# Patient Record
Sex: Male | Born: 1990 | Race: Black or African American | Hispanic: No | Marital: Single | Smoking: Current every day smoker
Health system: Southern US, Community
[De-identification: ages and names within clinical notes are randomized; demographics above are authoritative.]

## PROBLEM LIST (undated history)

## (undated) DIAGNOSIS — L0291 Cutaneous abscess, unspecified: Secondary | ICD-10-CM

---

## 2011-10-26 ENCOUNTER — Encounter (HOSPITAL_COMMUNITY): Payer: Self-pay | Admitting: *Deleted

## 2011-10-26 ENCOUNTER — Emergency Department (INDEPENDENT_AMBULATORY_CARE_PROVIDER_SITE_OTHER)
Admission: EM | Admit: 2011-10-26 | Discharge: 2011-10-26 | Disposition: A | Discharge status: Home / Self Care | Payer: Self-pay | Source: Home / Self Care

## 2011-10-26 DIAGNOSIS — L0202 Furuncle of face: Secondary | ICD-10-CM

## 2011-10-26 HISTORY — DX: Cutaneous abscess, unspecified: L02.91

## 2011-10-26 MED ORDER — DOXYCYCLINE HYCLATE 100 MG PO CAPS: 100.0000 mg | ORAL_CAPSULE | Freq: Two times a day (BID) | ORAL | Status: AC

## 2011-10-26 NOTE — ED Provider Notes (Signed)
History     CSN: [id number]  Arrival date & time 10/26/11  7829   None     Chief Complaint  Patient presents with  . Facial Swelling  . Abscess    (Consider location/radiation/quality/duration/timing/severity/associated sxs/prior treatment) HPI Comments: Patient reports a painful swelling on his left cheek x 2 days.  States he squeezed it an pus came out, but then the lesion increased in size overnight.  Denies fevers, dental pain, sore throat, ear pain, difficulty swallowing or breathing.    Patient is a 21 y.o. male presenting with abscess. The history is provided by the patient.  Abscess  Pertinent negatives include no fever, no congestion, no rhinorrhea and no sore throat.    Past Medical History  Diagnosis Date  . Abscess     History reviewed. No pertinent past surgical history.  History reviewed. No pertinent family history.  History  Substance Use Topics  . Smoking status: Current Everyday Smoker  . Smokeless tobacco: Not on file  . Alcohol Use: No      Review of Systems  Constitutional: Negative for fever.  HENT: Negative for congestion, sore throat, rhinorrhea, mouth sores, trouble swallowing, neck stiffness and sinus pressure.   Respiratory: Negative for shortness of breath and stridor.   All other systems reviewed and are negative.    Allergies  Ibuprofen  Home Medications   Current Outpatient Rx  Name Route Sig Dispense Refill  . ASPIRIN 325 MG PO TABS Oral Take 325 mg by mouth daily.      BP 129/72  Pulse 58  Temp(Src) 98.5 F (36.9 C) (Oral)  Resp 14  SpO2 99%  Physical Exam  Nursing note and vitals reviewed. Constitutional: He is oriented to person, place, and time. He appears well-developed and well-nourished.  HENT:  Head: Normocephalic and atraumatic.    Left Ear: Tympanic membrane and external ear normal.  Mouth/Throat: Uvula is midline, oropharynx is clear and moist and mucous membranes are normal. Normal dentition. No  dental abscesses or uvula swelling. No oropharyngeal exudate.  Neck: Normal range of motion. Neck supple.  Pulmonary/Chest: Effort normal.  Neurological: He is alert and oriented to person, place, and time.    ED Course  Procedures (including critical care time)  Labs Reviewed - No data to display No results found.   1. Boil, face       MDM  Patient with small-medium sized boil/abscess left cheek.  Pt is well-appearing, nontoxic, afebrile, no e/o cellulitis.  Advised warm moist compresses throughout the day to encourage drainage as well as trial of antibiotics.  Patient is aware that he may need to have I&D performed if area does not drain and improve with this treatment.  Patient verbalizes understanding.          Rise Patience, [place] 10/26/11 1118

## 2011-10-26 NOTE — ED Notes (Signed)
Pt with abscess left side of face onset x 2 days - per pt squeezed and drainage - increased pain and swelling

## 2011-10-28 NOTE — ED Provider Notes (Signed)
Medical screening examination/treatment/procedure(s) were performed by non-physician practitioner and as supervising physician I was immediately available for consultation/collaboration.  Corrie Mckusick, MD 10/28/11 [phone]

## 2012-11-10 ENCOUNTER — Encounter (HOSPITAL_BASED_OUTPATIENT_CLINIC_OR_DEPARTMENT_OTHER): Payer: Self-pay | Admitting: *Deleted

## 2012-11-10 ENCOUNTER — Emergency Department (HOSPITAL_BASED_OUTPATIENT_CLINIC_OR_DEPARTMENT_OTHER): Payer: Self-pay

## 2012-11-10 ENCOUNTER — Emergency Department (HOSPITAL_BASED_OUTPATIENT_CLINIC_OR_DEPARTMENT_OTHER)
Admission: EM | Admit: 2012-11-10 | Discharge: 2012-11-10 | Disposition: A | Discharge status: Home / Self Care | Payer: Self-pay | Attending: Emergency Medicine | Admitting: Emergency Medicine

## 2012-11-10 DIAGNOSIS — Z872 Personal history of diseases of the skin and subcutaneous tissue: Secondary | ICD-10-CM | POA: Insufficient documentation

## 2012-11-10 DIAGNOSIS — R1084 Generalized abdominal pain: Secondary | ICD-10-CM | POA: Insufficient documentation

## 2012-11-10 DIAGNOSIS — F17200 Nicotine dependence, unspecified, uncomplicated: Secondary | ICD-10-CM | POA: Insufficient documentation

## 2012-11-10 DIAGNOSIS — Z7982 Long term (current) use of aspirin: Secondary | ICD-10-CM | POA: Insufficient documentation

## 2012-11-10 LAB — CBC WITH DIFFERENTIAL/PLATELET
Band Neutrophils: 0 % (ref 0–10)
Eosinophils Absolute: 0.2 10*3/uL (ref 0.0–0.7)
HCT: 45.7 % (ref 39.0–52.0)
Hemoglobin: 16.1 g/dL (ref 13.0–17.0)
LUC, Absolute: 0 10*3/uL (ref 0.0–0.5)
Lymphs Abs: 1.5 10*3/uL (ref 0.7–4.0)
MCV: 90.1 fL (ref 78.0–100.0)
Monocytes Absolute: 0.4 10*3/uL (ref 0.1–1.0)
Monocytes Relative: 7 % (ref 3–12)
Neutrophils Relative %: 63 % (ref 43–77)
Other: 0 %
Platelets: 113 10*3/uL — ABNORMAL LOW (ref 150–400)
Promyelocytes Absolute: 0 %
RBC: 5.07 MIL/uL (ref 4.22–5.81)
WBC: 5.6 10*3/uL (ref 4.0–10.5)

## 2012-11-10 LAB — COMPREHENSIVE METABOLIC PANEL
ALT: 16 U/L (ref 0–53)
Alkaline Phosphatase: 49 U/L (ref 39–117)
CO2: 24 mEq/L (ref 19–32)
GFR calc Af Amer: >90 mL/min (ref >90)
GFR calc non Af Amer: >90 mL/min (ref >90)
Glucose, Bld: 70 mg/dL (ref 70–99)
Potassium: 4.2 mEq/L (ref 3.5–5.1)
Sodium: 141 mEq/L (ref 135–145)

## 2012-11-10 LAB — URINALYSIS, ROUTINE W REFLEX MICROSCOPIC
Bilirubin Urine: NEGATIVE
Hgb urine dipstick: NEGATIVE
Protein, ur: NEGATIVE mg/dL
Urobilinogen, UA: 1 mg/dL (ref 0.0–1.0)

## 2012-11-10 LAB — URINE MICROSCOPIC-ADD ON

## 2012-11-10 MED ORDER — IOHEXOL 300 MG/ML  SOLN
100.0000 mL | Freq: Once | INTRAMUSCULAR | Status: AC | PRN
  Administered 2012-11-10: 100 mL via INTRAVENOUS

## 2012-11-10 MED ORDER — ONDANSETRON HCL 4 MG/2ML IJ SOLN
4.0000 mg | Freq: Once | INTRAMUSCULAR | Status: AC
  Administered 2012-11-10: 4 mg via INTRAVENOUS
  Filled 2012-11-10: qty 2

## 2012-11-10 MED ORDER — SODIUM CHLORIDE 0.9 % IV BOLUS (SEPSIS)
1000.0000 mL | Freq: Once | INTRAVENOUS | Status: AC
  Administered 2012-11-10: 1000 mL via INTRAVENOUS

## 2012-11-10 MED ORDER — IOHEXOL 300 MG/ML  SOLN
50.0000 mL | Freq: Once | INTRAMUSCULAR | Status: AC | PRN
  Administered 2012-11-10: 50 mL via ORAL

## 2012-11-10 NOTE — ED Provider Notes (Signed)
History     CSN: [id number]  Arrival date & time 11/10/12  0912   First MD Initiated Contact with Patient 11/10/12 [phone]      Chief Complaint  Patient presents with  . Abdominal Pain    (Consider location/radiation/quality/duration/timing/severity/associated sxs/prior treatment) HPI Patient complaining of pain began last night in lower abdomen with some radiation to epigastrium.  Denies nausea or vomiting.  Last ate last night at 8 - pain began after.  States hungry now.  Pain is sharp, better when he flexes trunk, no worsening features.  Patient drove here without problem.  Bowel movements normal last bm yesterday.  No urinary problems.    Past Medical History  Diagnosis Date  . Abscess     History reviewed. No pertinent past surgical history.  History reviewed. No pertinent family history.  History  Substance Use Topics  . Smoking status: Current Every Day Smoker  . Smokeless tobacco: Not on file  . Alcohol Use: No      Review of Systems  Constitutional: Negative.   HENT: Negative.   Eyes: Negative.   Respiratory: Negative.   Cardiovascular: Negative.   Gastrointestinal: Positive for abdominal pain and abdominal distention. Negative for nausea, vomiting, diarrhea, constipation, blood in stool and rectal pain.  Genitourinary: Negative for dysuria, discharge, scrotal swelling, enuresis, difficulty urinating, penile pain and testicular pain.  Musculoskeletal: Negative for myalgias, back pain and arthralgias.  Neurological: Negative for dizziness, seizures and headaches.  Hematological: Negative.     Allergies  Ibuprofen  Home Medications   Current Outpatient Rx  Name  Route  Sig  Dispense  Refill  . ASPIRIN 325 MG PO TABS   Oral   Take 325 mg by mouth daily.           BP 108/87  Pulse 64  Temp 98.1 F (36.7 C) (Oral)  Resp 18  SpO2 99%  Physical Exam  Nursing note and vitals reviewed. Constitutional: He is oriented to person, place, and time. He  appears well-developed and well-nourished.  HENT:  Head: Normocephalic and atraumatic.  Right Ear: External ear normal.  Left Ear: External ear normal.  Nose: Nose normal.  Mouth/Throat: Oropharynx is clear and moist.  Eyes: Conjunctivae normal and EOM are normal. Pupils are equal, round, and reactive to light.  Neck: Normal range of motion. Neck supple.  Cardiovascular: Normal rate, regular rhythm, normal heart sounds and intact distal pulses.   Pulmonary/Chest: Effort normal.  Abdominal: Soft. He exhibits no mass. There is tenderness. There is no rebound and no guarding.  Musculoskeletal: Normal range of motion.  Neurological: He is alert and oriented to person, place, and time.  Skin: Skin is warm and dry.  Psychiatric: He has a normal mood and affect. His behavior is normal. Judgment and thought content normal.    ED Course  Procedures (including critical care time)  Labs Reviewed - No data to display No results found.   No diagnosis found.    MDM   Results for orders placed during the hospital encounter of 11/10/12  COMPREHENSIVE METABOLIC PANEL      Component Value Range   Sodium 141  135 - 145 mEq/L   Potassium 4.2  3.5 - 5.1 mEq/L   Chloride 104  96 - 112 mEq/L   CO2 24  19 - 32 mEq/L   Glucose, Bld 70  70 - 99 mg/dL   BUN 11  6 - 23 mg/dL   Creatinine, Ser 6.57  0.50 - 1.35 mg/dL  Calcium 9.8  8.4 - 10.5 mg/dL   Total Protein 8.0  6.0 - 8.3 g/dL   Albumin 4.2  3.5 - 5.2 g/dL   AST 32  0 - 37 U/L   ALT 16  0 - 53 U/L   Alkaline Phosphatase 49  39 - 117 U/L   Total Bilirubin 0.5  0.3 - 1.2 mg/dL   GFR calc non Af Amer >90  >90 mL/min   GFR calc Af Amer >90  >90 mL/min  LIPASE, BLOOD      Component Value Range   Lipase 15  11 - 59 U/L  URINALYSIS, ROUTINE W REFLEX MICROSCOPIC      Component Value Range   Color, Urine YELLOW  YELLOW   APPearance CLEAR  CLEAR   Specific Gravity, Urine 1.027  1.005 - 1.030   pH 6.0  5.0 - 8.0   Glucose, UA NEGATIVE   NEGATIVE mg/dL   Hgb urine dipstick NEGATIVE  NEGATIVE   Bilirubin Urine NEGATIVE  NEGATIVE   Ketones, ur NEGATIVE  NEGATIVE mg/dL   Protein, ur NEGATIVE  NEGATIVE mg/dL   Urobilinogen, UA 1.0  0.0 - 1.0 mg/dL   Nitrite NEGATIVE  NEGATIVE   Leukocytes, UA SMALL (*) NEGATIVE  URINE MICROSCOPIC-ADD ON      Component Value Range   Squamous Epithelial / LPF RARE  RARE   WBC, UA 11-20  <3 WBC/hpf   RBC / HPF 0-2  <3 RBC/hpf   Bacteria, UA FEW (*) RARE   Urine-Other MUCOUS PRESENT     Ct Abdomen Pelvis W Contrast  11/10/2012  *RADIOLOGY REPORT*  Clinical Data: Abdominal pain.  CT ABDOMEN AND PELVIS WITH CONTRAST  Technique:  Multidetector CT imaging of the abdomen and pelvis was performed following the standard protocol during bolus administration of intravenous contrast.  Contrast: 50mL OMNIPAQUE IOHEXOL 300 MG/ML  SOLN, OMNIPAQUE IOHEXOL 300 MG/ML  SOLN  Comparison: None.  Findings: Lung bases are clear.  No pleural or pericardial effusion.  The gallbladder, liver, spleen, adrenal glands, pancreas and kidneys appear normal.  There is no lymphadenopathy or fluid.  The appendix is not discretely visualized but no evidence inflammatory process is seen in the abdomen or pelvis.  The stomach and small and large bowel are normal appearance.  No bony abnormality is seen.  IMPRESSION: Normal exam.   Original Report Authenticated By: Holley Dexter, M.D.    Patient without apparent acute intraabdominal abnormalities.  Patient advise regarding need for follow up and voices understanding.        Hilario Quarry, MD 11/10/12 1106

## 2012-11-10 NOTE — ED Notes (Signed)
Pt states "it might have been that I ate a lot of food and drank a lot of alcohol for the [employer]"

## 2012-11-10 NOTE — ED Notes (Signed)
Pt amb to room 5 with quick steady gait in nad. Pt reports "stomach ache" with abd cramping x last night. Denies any nausea, vomiting or diarrhea.

## 2012-11-10 NOTE — ED Notes (Signed)
Pt refuses iv access, pt states "I don't want a needle, I can't handle it!" pt encouraged to allow lab draw at least, but refuses. "I don't want no needles!" Dr. Rosalia Hammers informed.

## 2012-11-11 LAB — URINE CULTURE: Colony Count: 5000

## 2013-02-25 ENCOUNTER — Encounter (HOSPITAL_COMMUNITY): Payer: Self-pay

## 2013-02-25 ENCOUNTER — Emergency Department (HOSPITAL_COMMUNITY)
Admission: EM | Admit: 2013-02-25 | Discharge: 2013-02-25 | Disposition: A | Discharge status: Home / Self Care | Payer: No Typology Code available for payment source | Attending: Emergency Medicine | Admitting: Emergency Medicine

## 2013-02-25 DIAGNOSIS — S20412A Abrasion of left back wall of thorax, initial encounter: Secondary | ICD-10-CM

## 2013-02-25 DIAGNOSIS — T148XXA Other injury of unspecified body region, initial encounter: Secondary | ICD-10-CM

## 2013-02-25 DIAGNOSIS — Y9389 Activity, other specified: Secondary | ICD-10-CM | POA: Insufficient documentation

## 2013-02-25 DIAGNOSIS — S239XXA Sprain of unspecified parts of thorax, initial encounter: Secondary | ICD-10-CM | POA: Insufficient documentation

## 2013-02-25 DIAGNOSIS — Y92410 Unspecified street and highway as the place of occurrence of the external cause: Secondary | ICD-10-CM | POA: Insufficient documentation

## 2013-02-25 DIAGNOSIS — F17200 Nicotine dependence, unspecified, uncomplicated: Secondary | ICD-10-CM | POA: Insufficient documentation

## 2013-02-25 DIAGNOSIS — IMO0002 Reserved for concepts with insufficient information to code with codable children: Secondary | ICD-10-CM | POA: Insufficient documentation

## 2013-02-25 DIAGNOSIS — Z872 Personal history of diseases of the skin and subcutaneous tissue: Secondary | ICD-10-CM | POA: Insufficient documentation

## 2013-02-25 DIAGNOSIS — M549 Dorsalgia, unspecified: Secondary | ICD-10-CM

## 2013-02-25 MED ORDER — CYCLOBENZAPRINE HCL 10 MG PO TABS: 10.0000 mg | ORAL_TABLET | Freq: Two times a day (BID) | ORAL | Status: DC | PRN
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
## 2013-02-25 NOTE — Progress Notes (Signed)
[facility] has seen patient. Patient stated that he is pending insurance through his job. Provided with a list of primary care resources.

## 2013-02-25 NOTE — ED Provider Notes (Signed)
  Medical screening examination/treatment/procedure(s) were performed by non-physician practitioner and as supervising physician I was immediately available for consultation/collaboration.    Macaulay Reicher, MD 02/25/13 1527 
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
## 2014-01-24 ENCOUNTER — Emergency Department (HOSPITAL_COMMUNITY)
Admission: EM | Admit: 2014-01-24 | Discharge: 2014-01-24 | Disposition: A | Discharge status: Home / Self Care | Payer: No Typology Code available for payment source | Attending: Emergency Medicine | Admitting: Emergency Medicine

## 2014-01-24 ENCOUNTER — Emergency Department (HOSPITAL_COMMUNITY): Payer: No Typology Code available for payment source

## 2014-01-24 ENCOUNTER — Emergency Department (HOSPITAL_COMMUNITY): Payer: Self-pay

## 2014-01-24 ENCOUNTER — Encounter (HOSPITAL_COMMUNITY): Payer: Self-pay | Admitting: Emergency Medicine

## 2014-01-24 DIAGNOSIS — S0993XA Unspecified injury of face, initial encounter: Secondary | ICD-10-CM | POA: Insufficient documentation

## 2014-01-24 DIAGNOSIS — S82402A Unspecified fracture of shaft of left fibula, initial encounter for closed fracture: Secondary | ICD-10-CM

## 2014-01-24 DIAGNOSIS — S199XXA Unspecified injury of neck, initial encounter: Secondary | ICD-10-CM

## 2014-01-24 DIAGNOSIS — R404 Transient alteration of awareness: Secondary | ICD-10-CM | POA: Insufficient documentation

## 2014-01-24 DIAGNOSIS — F17200 Nicotine dependence, unspecified, uncomplicated: Secondary | ICD-10-CM | POA: Insufficient documentation

## 2014-01-24 DIAGNOSIS — Z872 Personal history of diseases of the skin and subcutaneous tissue: Secondary | ICD-10-CM | POA: Insufficient documentation

## 2014-01-24 DIAGNOSIS — Y92410 Unspecified street and highway as the place of occurrence of the external cause: Secondary | ICD-10-CM | POA: Insufficient documentation

## 2014-01-24 DIAGNOSIS — S0003XA Contusion of scalp, initial encounter: Secondary | ICD-10-CM | POA: Insufficient documentation

## 2014-01-24 DIAGNOSIS — S0083XA Contusion of other part of head, initial encounter: Secondary | ICD-10-CM | POA: Insufficient documentation

## 2014-01-24 DIAGNOSIS — S1093XA Contusion of unspecified part of neck, initial encounter: Secondary | ICD-10-CM

## 2014-01-24 DIAGNOSIS — Y9301 Activity, walking, marching and hiking: Secondary | ICD-10-CM | POA: Insufficient documentation

## 2014-01-24 DIAGNOSIS — S59919A Unspecified injury of unspecified forearm, initial encounter: Secondary | ICD-10-CM

## 2014-01-24 DIAGNOSIS — S6990XA Unspecified injury of unspecified wrist, hand and finger(s), initial encounter: Secondary | ICD-10-CM

## 2014-01-24 DIAGNOSIS — S59909A Unspecified injury of unspecified elbow, initial encounter: Secondary | ICD-10-CM | POA: Insufficient documentation

## 2014-01-24 DIAGNOSIS — S62501A Fracture of unspecified phalanx of right thumb, initial encounter for closed fracture: Secondary | ICD-10-CM

## 2014-01-24 DIAGNOSIS — S62609B Fracture of unspecified phalanx of unspecified finger, initial encounter for open fracture: Secondary | ICD-10-CM | POA: Insufficient documentation

## 2014-01-24 DIAGNOSIS — IMO0002 Reserved for concepts with insufficient information to code with codable children: Secondary | ICD-10-CM | POA: Insufficient documentation

## 2014-01-24 DIAGNOSIS — S82409A Unspecified fracture of shaft of unspecified fibula, initial encounter for closed fracture: Secondary | ICD-10-CM | POA: Insufficient documentation

## 2014-01-24 MED ORDER — OXYCODONE-ACETAMINOPHEN 5-325 MG PO TABS
2.0000 | ORAL_TABLET | Freq: Once | ORAL | Status: AC
  Administered 2014-01-24: 2 via ORAL
  Filled 2014-01-24: qty 2

## 2014-01-24 MED ORDER — OXYCODONE-ACETAMINOPHEN 5-325 MG PO TABS: 1.0000 | ORAL_TABLET | Freq: Four times a day (QID) | ORAL | Status: DC | PRN
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
## 2014-01-24 NOTE — Progress Notes (Signed)
[facility] provided pt with a list of primary care resources and a [facility] [employer] application to help patient establish primary care.  °

## 2014-01-24 NOTE — Discharge Instructions (Signed)
Keep left leg and right hand elevated as much as possible for next several days.   Fibular Fracture, Adult, Treated Without Immobilization You have a fracture (break) of your fibula. This is the bone in your lower leg located on the outside of the leg. These fractures are easily diagnosed with x-rays. TREATMENT  You have a simple fracture of the part of the fibula which is located between the knee and ankle. This bone usually will heal without problems and can often be treated without casting or splinting. This means the fracture will heal well during normal use and daily activities without being held in place. Sometimes a cast or splint is placed on these fractures if it is needed for comfort or if the bones are badly out of place. HOME CARE INSTRUCTIONS   Apply ice to the injury for 15-20 minutes, 03-04 times per day while awake, for 2 days. Put the ice in a plastic bag and place a thin towel between the bag of ice and your leg. This helps keep swelling down.  Use crutches as directed. Resume walking without crutches as directed by your caregiver or when comfortable doing so.  Only take over-the-counter or prescription medicines for pain, discomfort, or fever as directed by your caregiver.  Your caregiver may tell you to take off your removable cam boot.  Keep appointments for follow up X-rays if these are required.  Warning: Do not drive a car or operate a motor vehicle until your caregiver specifically tells you it is safe to do so. SEEK MEDICAL CARE IF:   You have continued severe pain or more swelling.  The medications do not control the pain.  Your skin or nails below the injury turn blue or grey or feel cold or numb.  You develop severe pain in the leg or foot. MAKE SURE YOU:   Understand these instructions.  Will watch your condition.  Will get help right away if you are not doing well or get worse. Document Released: 09/23/2005 Document Revised: 12/16/2011 Document  Reviewed: 04/29/2008 [facility] Patient Information 2014 [place], [place].  Thumb Fracture  There are many types of thumb fractures (breaks). There are different ways of treating these fractures, all of which may be correct, varying from case to case. Your caregiver will discuss different ways to treat these fractures with you. TREATMENT   Immobilization. This means the fracture is casted as it is without changing the positions of the fracture (bone pieces) involved. This fracture is casted in a "thumb spica" also called a hitchhiker cast. It is generally left on for 2 to 6 weeks.  Closed reduction. The bones are manipulated back into position without using surgery.  ORIF (open reduction and internal fixation). The fracture site is opened and the bone pieces are fixed into place with some type of hardware such as screws or wires. Your caregiver will discuss the type of fracture you have and the treatment that will be best for that problem. If surgery is the treatment of choice, the following is information for you to know and to let your caregiver know about prior to surgery. LET YOUR CAREGIVERS KNOW ABOUT:  Allergies.  Medications taken including herbs, eye drops, over the counter medications, and creams.  Use of steroids (by mouth or creams).  Previous problems with anesthetics or Novocain.  Family history of anesthetic complications..  Possibility of pregnancy, if this applies.  History of blood clots (thrombophlebitis).  History of bleeding or blood problems.  Previous surgery.  Other  health problems. AFTER THE PROCEDURE  After surgery, you will be taken to the recovery area. A nurse will watch and check your progress. Once you are awake, stable, and taking fluids well, barring other problems you will be allowed to go home. Once home, an ice pack applied to your operative site may help with discomfort and keep the swelling down. Elevate your hand above your heart as much as  possible for the first 4-5 days after the injury/surgery. HOME CARE INSTRUCTIONS   Follow your caregiver's instructions as to activities, exercises, physical therapy, and driving a car.  Use thumb and exercise as directed.  Only take over-the-counter or prescription medicines for pain, discomfort, or fever as directed by your caregiver. Do not take aspirin until your caregiver instructs. This can increase bleeding immediately following surgery. SEEK MEDICAL CARE IF:   There is increased bleeding (more than a small spot) from the wound or from beneath your cast or splint.  There is redness, swelling, or increasing pain in the wound or from beneath your cast or splint.  You have pus coming from wound or from beneath your cast or splint.  An unexplained oral temperature above 102 F (38.9 C) develops.  There is a foul smell coming from the wound or dressing or from beneath your cast or splint. SEEK IMMEDIATE MEDICAL CARE IF:   You develop severe pain, decreased sensation such as numbness or tingling.  You develop a rash.  You have difficulty breathing.  Youhave any allergic problems. If you do not have a window in your cast for observing the wound, a discharge or minor bleeding may show up as a stain on the outside of your cast. Report these findings to your caregiver. If you have a removable splint overlying the surgical dressings it is common to see a small amount of bleeding. Change the dressings as instructed by your caregiver. Document Released: 06/22/2003 Document Revised: 12/16/2011 Document Reviewed: 02/01/2008 [facility] Patient Information 2014 [place], [place].

## 2014-01-24 NOTE — ED Notes (Signed)
Spoke with [place]issa, [place].  Due to pt stating LOC, placing acuity 3 and ordering CT head/cervical.

## 2014-07-01 ENCOUNTER — Encounter (HOSPITAL_COMMUNITY): Payer: Self-pay | Admitting: Emergency Medicine

## 2014-07-01 ENCOUNTER — Emergency Department (HOSPITAL_COMMUNITY)
Admission: EM | Admit: 2014-07-01 | Discharge: 2014-07-01 | Disposition: A | Discharge status: Home / Self Care | Payer: No Typology Code available for payment source | Attending: Emergency Medicine | Admitting: Emergency Medicine

## 2014-07-01 DIAGNOSIS — Z872 Personal history of diseases of the skin and subcutaneous tissue: Secondary | ICD-10-CM | POA: Insufficient documentation

## 2014-07-01 DIAGNOSIS — F17200 Nicotine dependence, unspecified, uncomplicated: Secondary | ICD-10-CM | POA: Insufficient documentation

## 2014-07-01 DIAGNOSIS — T61771A Other fish poisoning, accidental (unintentional), initial encounter: Secondary | ICD-10-CM | POA: Insufficient documentation

## 2014-07-01 DIAGNOSIS — Y929 Unspecified place or not applicable: Secondary | ICD-10-CM | POA: Insufficient documentation

## 2014-07-01 DIAGNOSIS — T783XXA Angioneurotic edema, initial encounter: Secondary | ICD-10-CM | POA: Insufficient documentation

## 2014-07-01 DIAGNOSIS — Y9389 Activity, other specified: Secondary | ICD-10-CM | POA: Insufficient documentation

## 2014-07-01 DIAGNOSIS — T7840XA Allergy, unspecified, initial encounter: Secondary | ICD-10-CM

## 2014-07-01 MED ORDER — SODIUM CHLORIDE 0.9 % IV BOLUS (SEPSIS)
1000.0000 mL | Freq: Once | INTRAVENOUS | Status: AC
  Administered 2014-07-01: 1000 mL via INTRAVENOUS

## 2014-07-01 MED ORDER — FAMOTIDINE IN NACL 20-0.9 MG/50ML-% IV SOLN
20.0000 mg | Freq: Once | INTRAVENOUS | Status: AC
  Administered 2014-07-01: 20 mg via INTRAVENOUS
  Filled 2014-07-01: qty 50

## 2014-07-01 MED ORDER — FAMOTIDINE 20 MG PO TABS: 20.0000 mg | ORAL_TABLET | Freq: Two times a day (BID) | ORAL | Status: DC

## 2014-07-01 MED ORDER — METHYLPREDNISOLONE SODIUM SUCC 125 MG IJ SOLR
125.0000 mg | Freq: Once | INTRAMUSCULAR | Status: AC
  Administered 2014-07-01: 125 mg via INTRAVENOUS
  Filled 2014-07-01: qty 2

## 2014-07-01 MED ORDER — DIPHENHYDRAMINE HCL 50 MG/ML IJ SOLN
25.0000 mg | Freq: Once | INTRAMUSCULAR | Status: AC
  Administered 2014-07-01: 25 mg via INTRAVENOUS
  Filled 2014-07-01: qty 1

## 2014-07-01 MED ORDER — PREDNISONE 50 MG PO TABS: ORAL_TABLET | ORAL | Status: DC

## 2014-07-01 MED ORDER — EPINEPHRINE HCL 0.1 MG/ML IJ SOSY
0.3000 mg | PREFILLED_SYRINGE | Freq: Once | INTRAMUSCULAR | Status: AC
  Administered 2014-07-01: 0.3 mg via INTRAMUSCULAR
  Filled 2014-07-01: qty 10

## 2014-07-01 MED ORDER — DIPHENHYDRAMINE HCL 25 MG PO TABS: 50.0000 mg | ORAL_TABLET | ORAL | Status: DC | PRN

## 2014-07-01 MED ORDER — EPINEPHRINE 0.3 MG/0.3ML IJ SOAJ: 0.3000 mg | Freq: Once | INTRAMUSCULAR | Status: AC | PRN

## 2014-07-01 NOTE — ED Provider Notes (Signed)
History/physical exam/procedure(s) were performed by non-physician practitioner and as supervising physician I was immediately available for consultation/collaboration. I have reviewed all notes and am in agreement with care and plan.   Hilario Quarry, MD 07/01/14 [phone]

## 2014-07-01 NOTE — Progress Notes (Signed)
[facility] Community Liaison Stacy,  ° °Provided pt with a list of primary care resources and a [facility] [employer] Card application to help patient establish primary care.  °

## 2014-07-01 NOTE — ED Notes (Signed)
Pt presents with complaint of allergic reaction. Pt states that he had a meal last night around 2330 and has had itching and lip swelling that began around 0330. Pt has moderate swelling to upper and lower lip swelling with mild swelling in the lower face. Pt reports that he feels as if his throat is itching and feels like it is difficult to breath. Pt has an oxygen saturation of 100% on room air and is talking in full and complete sentences. Pt denies having any known allergies.

## 2014-07-01 NOTE — ED Provider Notes (Signed)
CSN: [id number]     Arrival date & time 07/01/14  1319 History   First MD Initiated Contact with Patient 07/01/14 1332     Chief Complaint  Patient presents with  . Allergic Reaction     (Consider location/radiation/quality/duration/timing/severity/associated sxs/prior Treatment) Patient is a 23 y.o. male presenting with allergic reaction.  Allergic Reaction   Jerrad Mendibles is a 23 y.o. male who is otherwise healthy complaining of allergic reaction onset at 3:30 AM last night, it woke him from sleep. Patient states that he ate chicken marsala at 7:30 PM which she's had before with no bad reaction and he had fish with cayenne pepper approximately 11:30 PM he went to sleep without incident, he awoke with (, hives to the bilateral flank areas and swelling to the upper lip. Patient states he went back to bed and when he woke up this morning the swelling to the lip is worse. Patient denies any medications including ACE inhibitors, any new medications including over-the-counter,new pets, or environmental exposures. Patient states that he is short of breath and he feels clogged in the nose and mouth area. States that the itching is largely resolved. No medication prior to arrival. Patient had similar episode approximately one year ago. Has never had any allergy testing. His never had an anaphylactic reaction requiring epinephrine. Patient denies nausea, vomiting, dyspepsia. As per the patient's wife she feels the swelling is worsening over the course of the day since she last saw him at 8 AM.  Past Medical History  Diagnosis Date  . Abscess    History reviewed. No pertinent past surgical history. No family history on file. History  Substance Use Topics  . Smoking status: Current Every Day Smoker -- 0.75 packs/day    Types: Cigarettes  . Smokeless tobacco: Not on file  . Alcohol Use: Yes     Comment: drinks close to 12 pk/almost daily    Review of Systems  10 systems reviewed and found to be  negative, except as noted in the HPI.   Allergies  Ibuprofen  Home Medications   Prior to Admission medications   Not on File   BP 133/76  Pulse 91  Temp(Src) 98.4 F (36.9 C) (Oral)  Resp 20  SpO2 100% Physical Exam  Nursing note and vitals reviewed. Constitutional: He is oriented to person, place, and time. He appears well-developed and well-nourished. No distress.  HENT:  Head: Normocephalic and atraumatic.  Mouth/Throat: Oropharynx is clear and moist.  Angioedema to talk left and right mandibular area, no tong swelling, no posterior pharynx edema, patient is handling his secretions without issue. Speaking in complete sentences.  Eyes: Conjunctivae and EOM are normal. Pupils are equal, round, and reactive to light.  Cardiovascular: Normal rate, regular rhythm and intact distal pulses.   Pulmonary/Chest: Effort normal and breath sounds normal. No stridor. No respiratory distress. He has no wheezes. He has no rales. He exhibits no tenderness.  No wheezing, excellent air movement in all fields.  Abdominal: Soft. Bowel sounds are normal. He exhibits no distension and no mass. There is no tenderness. There is no rebound and no guarding.  Musculoskeletal: Normal range of motion.  Neurological: He is alert and oriented to person, place, and time.  Skin: No rash noted.  Psychiatric: He has a normal mood and affect.    ED Course  Procedures (including critical care time) Labs Review Labs Reviewed - No data to display  Imaging Review No results found.   EKG Interpretation None  MDM   Final diagnoses:  Allergic reaction, initial encounter  Angio-edema, initial encounter    Filed Vitals:   07/01/14 1323  BP: 133/76  Pulse: 91  Temp: 98.4 F (36.9 C)  TempSrc: Oral  Resp: 20  SpO2: 100%    Medications  sodium chloride 0.9 % bolus 1,000 mL (1,000 mLs Intravenous New Bag/Given 07/01/14 1505)  sodium chloride 0.9 % bolus 1,000 mL (0 mLs Intravenous Stopped  07/01/14 1435)  methylPREDNISolone sodium succinate (SOLU-MEDROL) 125 mg/2 mL injection 125 mg (125 mg Intravenous Given 07/01/14 1342)  famotidine (PEPCID) IVPB 20 mg (0 mg Intravenous Stopped 07/01/14 1435)  diphenhydrAMINE (BENADRYL) injection 25 mg (25 mg Intravenous Given 07/01/14 1342)  EPINEPHrine (ADRENALIN) 0.1 MG/ML injection 0.3 mg (0.3 mg Intramuscular Given 07/01/14 1410)    Izeah Vossler is a 23 y.o. male presenting with allergic reaction and angioedema. Patient states he feels short of breath however he is reclining comfortably, in complete sentences, lung sounds are clear to auscultation with excellent air movement in all fields no wheezing. Because of the anterior oropharyngeal swelling and hives patient will be given epinephrine. Case discussed with attending physician who agrees with care plan.  254, patient seen and evaluated the bedside, he is resting comfortably does not appear the swelling has improved.  3:53 PM: Patient seen and evaluated the bedside, there is slight improvement in in June edema, lung sounds are clear. I've advised patient it will be watched 05 at 5:30. I advised him that it is very important that he followup for allergy testing. Patient will be given a prescription for EpiPen. I have advised him of the appropriate situations to use the EpiPen I am advising him of the condyle 911 as it is he administers the epinephrine.  Case signed out to NP Katrinka Blazing at shift change: Plan is to observe patient for rebound reaction until 5 PM.  New Prescriptions   DIPHENHYDRAMINE (BENADRYL) 25 MG TABLET    Take 2 tablets (50 mg total) by mouth every 4 (four) hours as needed for itching.   EPINEPHRINE (EPIPEN 2-PAK) 0.3 MG/0.3 ML IJ SOAJ INJECTION    Inject 0.3 mLs (0.3 mg total) into the muscle once as needed (for severe allergic reaction). CAll 911 immediately if you have to use this medicine   FAMOTIDINE (PEPCID) 20 MG TABLET    Take 1 tablet (20 mg total) by mouth 2 (two) times  daily.   PREDNISONE (DELTASONE) 50 MG TABLET    Take 1 tablet daily with breakfast         Wynetta Emery, PA-C 07/01/14 1555

## 2014-07-01 NOTE — ED Provider Notes (Signed)
Patient reevaluated.  Patient states he feels like swelling of lips has improved.  Mild angioedema is present without tongue swelling or airway involvement.  Lungs CTA bilaterally.  Discharge plan reinforced with patient.  Jimmye Norman, NP 07/01/14 [phone]

## 2014-07-01 NOTE — Discharge Instructions (Signed)
If you have minister your EpiPen call 911 immediately, you must present to the emergency room after any administration of an epinephrine pen.  Do not hesitate to call 911 or return to the emergency room if you develop any shortness of breath, wheezing, tongue or lip swelling.  Do not hesitate to return to the emergency room for any new, worsening or concerning symptoms.  Please obtain primary care using resource guide below. But the minute you were seen in the emergency room and that they will need to obtain records for further outpatient management.    Allergies Allergies may happen from anything your body is sensitive to. This may be food, medicines, pollens, chemicals, and nearly anything around you in everyday life that produces allergens. An allergen is anything that causes an allergy producing substance. Heredity is often a factor in causing these problems. This means you may have some of the same allergies as your parents. Food allergies happen in all age groups. Food allergies are some of the most severe and life threatening. Some common food allergies are cow's milk, seafood, eggs, nuts, wheat, and soybeans. SYMPTOMS   Swelling around the mouth.  An itchy red rash or hives.  Vomiting or diarrhea.  Difficulty breathing. SEVERE ALLERGIC REACTIONS ARE LIFE-THREATENING. This reaction is called anaphylaxis. It can cause the mouth and throat to swell and cause difficulty with breathing and swallowing. In severe reactions only a trace amount of food (for example, peanut oil in a salad) may cause death within seconds. Seasonal allergies occur in all age groups. These are seasonal because they usually occur during the same season every year. They may be a reaction to molds, grass pollens, or tree pollens. Other causes of problems are house dust mite allergens, pet dander, and mold spores. The symptoms often consist of nasal congestion, a runny itchy nose associated with sneezing, and tearing  itchy eyes. There is often an associated itching of the mouth and ears. The problems happen when you come in contact with pollens and other allergens. Allergens are the particles in the air that the body reacts to with an allergic reaction. This causes you to release allergic antibodies. Through a chain of events, these eventually cause you to release histamine into the blood stream. Although it is meant to be protective to the body, it is this release that causes your discomfort. This is why you were given anti-histamines to feel better. If you are unable to pinpoint the offending allergen, it may be determined by skin or blood testing. Allergies cannot be cured but can be controlled with medicine. Hay fever is a collection of all or some of the seasonal allergy problems. It may often be treated with simple over-the-counter medicine such as diphenhydramine. Take medicine as directed. Do not drink alcohol or drive while taking this medicine. Check with your caregiver or package insert for child dosages. If these medicines are not effective, there are many new medicines your caregiver can prescribe. Stronger medicine such as nasal spray, eye drops, and corticosteroids may be used if the first things you try do not work well. Other treatments such as immunotherapy or desensitizing injections can be used if all else fails. Follow up with your caregiver if problems continue. These seasonal allergies are usually not life threatening. They are generally more of a nuisance that can often be handled using medicine. HOME CARE INSTRUCTIONS   If unsure what causes a reaction, keep a diary of foods eaten and symptoms that follow. Avoid foods that  cause reactions.  If hives or rash are present:  Take medicine as directed.  You may use an over-the-counter antihistamine (diphenhydramine) for hives and itching as needed.  Apply cold compresses (cloths) to the skin or take baths in cool water. Avoid hot baths or  showers. Heat will make a rash and itching worse.  If you are severely allergic:  Following a treatment for a severe reaction, hospitalization is often required for closer follow-up.  Wear a medic-alert bracelet or necklace stating the allergy.  You and your family must learn how to give adrenaline or use an anaphylaxis kit.  If you have had a severe reaction, always carry your anaphylaxis kit or EpiPen with you. Use this medicine as directed by your caregiver if a severe reaction is occurring. Failure to do so could have a fatal outcome. SEEK MEDICAL CARE IF:  You suspect a food allergy. Symptoms generally happen within 30 minutes of eating a food.  Your symptoms have not gone away within 2 days or are getting worse.  You develop new symptoms.  You want to retest yourself or your child with a food or drink you think causes an allergic reaction. Never do this if an anaphylactic reaction to that food or drink has happened before. Only do this under the care of a caregiver. SEEK IMMEDIATE MEDICAL CARE IF:   You have difficulty breathing, are wheezing, or have a tight feeling in your chest or throat.  You have a swollen mouth, or you have hives, swelling, or itching all over your body.  You have had a severe reaction that has responded to your anaphylaxis kit or an EpiPen. These reactions may return when the medicine has worn off. These reactions should be considered life threatening. MAKE SURE YOU:   Understand these instructions.  Will watch your condition.  Will get help right away if you are not doing well or get worse. Document Released: 12/17/2002 Document Revised: 01/18/2013 Document Reviewed: 05/23/2008 [facility] Patient Information 2015 [place], [place]. This information is not intended to replace advice given to you by your health care provider. Make sure you discuss any questions you have with your health care provider.   Emergency Department Resource Guide 1) Find a  Doctor and Pay Out of Pocket Although you won't have to find out who is covered by your insurance plan, it is a good idea to ask around and get recommendations. You will then need to call the office and see if the doctor you have chosen will accept you as a new patient and what types of options they offer for patients who are self-pay. Some doctors offer discounts or will set up payment plans for their patients who do not have insurance, but you will need to ask so you aren't surprised when you get to your appointment.  2) Contact Your Local Health Department Not all health departments have doctors that can see patients for sick visits, but many do, so it is worth a call to see if yours does. If you don't know where your local health department is, you can check in your phone book. The CDC also has a tool to help you locate your state's health department, and many state websites also have listings of all of their local health departments.  3) Find a [facility] If your illness is not likely to be very severe or complicated, you may want to try a walk in clinic. These are popping up all over the country in pharmacies, drugstores, and  shopping centers. They're usually staffed by nurse practitioners or physician assistants that have been trained to treat common illnesses and complaints. They're usually fairly quick and inexpensive. However, if you have serious medical issues or chronic medical problems, these are probably not your best option.  No Primary Care Doctor: - Call Health Connect at  [phone] - they can help you locate a primary care doctor that  accepts your insurance, provides certain services, etc. - Physician Referral Service- [phone]  Chronic Pain Problems: Organization         Address  Phone   Notes  [facility]  [phone] Patients need to be referred by their primary care doctor.   Medication Assistance: Organization         Address  Phone    Notes  [facility] Medication [facility] [address] [place], [place] [address] [phone] --Must be a resident of [facility] -- Must have NO insurance coverage whatsoever (no Medicaid/ Medicare, etc.) -- The pt. MUST have a primary care doctor that directs their care regularly and follows them in the community   MedAssist  [phone]   [employer]  [phone]    Agencies that provide inexpensive medical care: Organization         Address  Phone   Notes  [place]  [phone]   [facility]    [phone]   [facility] [place], [place] [address] [phone]   [place] [address] [place] [phone]   Planned Parenthood    [phone]   [facility]    [phone]   [place] and [place] [address], [place] Phone:  [phone], Fax:  [phone] Hours of Operation:  9 am - 6 pm, M-F.  Also accepts Medicaid/Medicare and self-pay.  [facility] for [place], [address], [place] Phone: [phone], Fax: [phone]. Hours of Operation:  8:30 am - 5:30 pm, M-F.  Also accepts Medicaid and self-pay.  [facility] [address], [place] Phone: [phone]   [place], [place], [place] [phone], Ext. [phone] Mondays & Thursdays: 7-9 AM.  First 15 patients are seen on a first come, first serve basis.    [place] Providers:  Organization         Address  Phone   Notes  [facility] [address], [place] [phone] Also accepts self-pay patients.  [facility] [address], [place]  [phone]   [place], [address], [place] [phone]   [facility] [address], [place] [phone]   Lucianne Lei [address], [place]   [phone] Only accepts [place] Access [place] patients after they have their name applied to their card.   Self-Pay (no insurance) in [facility]:  Organization         Address  Phone   Notes  Sickle Cell Patients, [facility] Internal Medicine [place] [phone]   [facility] [place] [phone]   [facility] [place]  [place] [address] [place], [place] [phone]  [facility]/Dr. Osei-Bonsu  [address] [place] or [address], [place] [phone] Phone number for both [place] and [place] locations is the same.  Urgent Medical and [facility] [address], [place] [phone]   [facility] [address], [place] or [address] [phone]   [facility] [address], [place] [phone], phone; [phone], fax Sees patients 1st and 3rd Saturday of every month.  Must not qualify for public or private insurance (i.e. Medicaid, Medicare, [place] Health Choice, Veterans' Benefits)  Household income should be no more than 200% of the poverty level The clinic cannot treat you if you are pregnant or think you are pregnant  Sexually transmitted diseases are not treated at the clinic.    Dental Care: Organization         Address  Phone  Notes  [facility] of [facility] [phone] Accepts children up to age 74 who are enrolled in [place] or [place]; pregnant women with a Medicaid card; and children who have applied for Medicaid or [place] Health Choice, but were declined, whose parents can pay a reduced fee at time of service.  [facility] of [facility]  [address], [place] [phone] Accepts children up to age 60 who are enrolled in [place] or [place]; pregnant women with a Medicaid card; and children who have applied for Medicaid or [place] Health Choice, but were declined, whose parents can pay a reduced fee at time of service.  [place] Adult Dental Access PROGRAM  [place] [phone] Patients are seen by appointment only. Walk-ins are not accepted. [place] will see patients 4 years of age and older. Monday - Tuesday (8am-5pm) Most Wednesdays (8:30-5pm) $30 per visit, cash only  [facility] Adult Dental Access PROGRAM  [address], [facility] [phone] Patients are seen by appointment only. Walk-ins are not accepted. [place] will see patients 46 years of age and older. One Wednesday Evening (Monthly: Volunteer Based).  $30 per visit, cash only  [place]  [phone] for adults; Children under age 27, call Graduate Pediatric Dentistry at [phone]. Children aged 67-14, please call [phone] to request a pediatric application.  Dental services are provided in all areas of dental care including fillings, crowns and bridges, complete and partial dentures, implants, gum treatment, root canals, and extractions. Preventive care is also provided. Treatment is provided to both adults and children. Patients are selected via a lottery and there is often a waiting list.   [facility] [address] [place]  [phone] [email or web address]   [facility] [address] [place], [place] [phone], Ext. [phone] Second and Fourth Thursday of each month, opens at 6:30 AM; Clinic ends at 9 AM.  Patients are seen on a first-come first-served basis, and a limited number are seen during each clinic.   [facility]  [address] [place], [place] [phone]   Eligibility Requirements You must have lived in  [place], [place], or [place] counties for at least the last three months.   You cannot be eligible for state or federal sponsored [employer], including [employer], [place], or [employer].   You generally cannot be eligible for healthcare insurance  through your employer.    How to apply: Eligibility screenings are held every Tuesday and Wednesday afternoon from 1:00 pm until 4:00 pm. You do not need an appointment for the interview!  [facility] [address], [place], [place]   [place]  [place] Department  [place]  [phone]    Behavioral Health Resources in the Community: Intensive Outpatient Programs Organization         Address  Phone  Notes  [place]. [address] [place], [place] [phone]   [facility] [address], [place], [place]   ADS: Alcohol & Drug Svcs [address] [place], [place]   [place] [address]  [place], [place] or [phone]   Substance Abuse Resources Organization         Address  Phone  Notes  Alcohol and Drug Services  [phone]   [place]  [phone]   [facility] [place]   Chinita Pester  [phone]   Residential & Outpatient Substance Abuse Program  [phone]   Psychological Services Organization         Address  Phone  Notes  [facility] [place]  [place]  [phone]   [place] [address] [place] or [phone]    Mobile Crisis Teams Organization         Address  Phone  Notes  [facility], Mobile Crisis Care Unit  [phone]   [facility]  [address] [place], [place]   [place] [address] [place] [phone]    Self-Help/Support Groups Organization         Address  Phone             Notes  [place]. of [place] - variety of support groups  [place] Call for more information  Narcotics Anonymous (NA), [facility] [address], [employer] [place]  2 meetings at this location   Special educational needs teacher         Address  Phone  Notes  ASAP Residential Treatment [place],    [place]  [phone]   [facility]  [address], [place] 292446, [place], [place]   [place], [place] [phone] Admissions: 8am-3pm M-F  Incentives Substance [place] [address]    [place], [place] [phone]   [facility] [address] [place], [place], [place]   [facility] [address]  [place], [place]   [facility] - Intensive Outpatient [address] Kristeen Mans [address], [place], [place]   [facility] ([place].,  [place], [place] [phone] or [phone]   [facility] ([facility]) [address], [place], [place] Accepts Medicaid  [facility] [place] [address],  [place] [phone])[phone] Substance Abuse/Addiction Treatment   [facility] Organization         Address  Phone  Notes  [facility]  [phone]   Domenic Schwab, PhD [address] [place], [place]   [phone] or [phone]   [place], [place] [phone]  [facility] [address], [place], [place] [phone] Insurance/Medicaid/sponsorship through [employer] and Families [address], [address] [place], [place] [phone] [place], [place] [phone]    Dr. Adele Schilder  [phone]   [facility] Dept. 1) [address] [place] 2) [place] 3)  [place] [address], [place] [phone]  [phone]   [place] [phone] or [phone] (After Hours)

## 2014-07-02 NOTE — ED Provider Notes (Signed)
Medical screening examination/treatment/procedure(s) were performed by non-physician practitioner and as supervising physician I was immediately available for consultation/collaboration.   EKG Interpretation None       Veva Grimley, MD 07/02/14 1536 

## 2014-08-20 ENCOUNTER — Emergency Department (HOSPITAL_COMMUNITY)
Admission: EM | Admit: 2014-08-20 | Discharge: 2014-08-20 | Disposition: A | Discharge status: Home / Self Care | Payer: No Typology Code available for payment source | Attending: Emergency Medicine | Admitting: Emergency Medicine

## 2014-08-20 ENCOUNTER — Encounter (HOSPITAL_COMMUNITY): Payer: Self-pay | Admitting: Emergency Medicine

## 2014-08-20 DIAGNOSIS — Y998 Other external cause status: Secondary | ICD-10-CM | POA: Insufficient documentation

## 2014-08-20 DIAGNOSIS — R220 Localized swelling, mass and lump, head: Secondary | ICD-10-CM | POA: Insufficient documentation

## 2014-08-20 DIAGNOSIS — Z872 Personal history of diseases of the skin and subcutaneous tissue: Secondary | ICD-10-CM | POA: Insufficient documentation

## 2014-08-20 DIAGNOSIS — T39315A Adverse effect of propionic acid derivatives, initial encounter: Secondary | ICD-10-CM | POA: Insufficient documentation

## 2014-08-20 DIAGNOSIS — Y9289 Other specified places as the place of occurrence of the external cause: Secondary | ICD-10-CM | POA: Insufficient documentation

## 2014-08-20 DIAGNOSIS — T7840XA Allergy, unspecified, initial encounter: Secondary | ICD-10-CM

## 2014-08-20 DIAGNOSIS — T783XXA Angioneurotic edema, initial encounter: Secondary | ICD-10-CM | POA: Insufficient documentation

## 2014-08-20 DIAGNOSIS — Y9389 Activity, other specified: Secondary | ICD-10-CM | POA: Insufficient documentation

## 2014-08-20 DIAGNOSIS — Z720 Tobacco use: Secondary | ICD-10-CM | POA: Insufficient documentation

## 2014-08-20 MED ORDER — PREDNISONE 50 MG PO TABS: ORAL_TABLET | ORAL | Status: AC

## 2014-08-20 MED ORDER — DIPHENHYDRAMINE HCL 25 MG PO TABS
50.0000 mg | ORAL_TABLET | ORAL | Status: AC | PRN
Start: 2014-08-20 — End: ?

## 2014-08-20 MED ORDER — FAMOTIDINE 20 MG PO TABS
20.0000 mg | ORAL_TABLET | Freq: Once | ORAL | Status: AC
  Administered 2014-08-20: 20 mg via ORAL
  Filled 2014-08-20: qty 1

## 2014-08-20 MED ORDER — METHYLPREDNISOLONE SODIUM SUCC 125 MG IJ SOLR
125.0000 mg | Freq: Once | INTRAMUSCULAR | Status: AC
  Administered 2014-08-20: 125 mg via INTRAMUSCULAR
  Filled 2014-08-20: qty 2

## 2014-08-20 MED ORDER — FAMOTIDINE 20 MG PO TABS: 20.0000 mg | ORAL_TABLET | Freq: Two times a day (BID) | ORAL | Status: AC

## 2014-08-20 MED ORDER — DIPHENHYDRAMINE HCL 25 MG PO CAPS
50.0000 mg | ORAL_CAPSULE | Freq: Once | ORAL | Status: AC
  Administered 2014-08-20: 50 mg via ORAL
  Filled 2014-08-20: qty 2

## 2014-08-20 NOTE — ED Provider Notes (Signed)
CSN: [id number]     Arrival date & time 08/20/14  1811 History  This chart was scribed for non-physician practitioner, Jimmy MuttonMarissa Tianne Plott, PA-C, working with Jimmy Foresterrey Wofford, MD, by Jimmy Cook, ED Scribe. This patient was seen in room WTR5/WTR5 and the patient's care was started at 8:04 PM.   Chief Complaint  Patient presents with  . Facial Swelling   The history is provided by the patient. No language interpreter was used.   HPI Comments: Jimmy Cook is a 23 y.o. male with a PMHx of abscess who presents to the Emergency Department complaining of facial swelling that started last night, at approximately 2:00 AM. He reports that he took 2 Aleve, 200 mg, last night at 1:30am and it caused his lips to swell. Patient reported that when he woke up this morning the swelling improved tremendously, stating that his lower lip has improved, but continued to have swelling to the upper lip. Patient reported that he took 2 benadryl 25 mg tablets today, at 1:00-2:00PM, stating that the swelling improved, but continues to be present. Patient reported that he was able to eat to fish delays and a burger without difficulty. He denies any SOB, difficulty swallowing, difficulty breathing, tongue swelling, visual disturbance, sensation loss, blurred, vision, sudden loss of vision, nausea, vomiting, dizziness, syncope sensation, throat closing sensation. Denied use of ace inhibitors or any other medications. Denied changes or new medications. PCP none    Past Medical History  Diagnosis Date  . Abscess    No past surgical history on file. No family history on file. History  Substance Use Topics  . Smoking status: Current Every Day Smoker -- 0.75 packs/day    Types: Cigarettes  . Smokeless tobacco: Not on file  . Alcohol Use: Yes     Comment: drinks close to 12 pk/almost daily    Review of Systems  HENT: Positive for facial swelling. Negative for trouble swallowing.   Eyes: Negative for visual disturbance.   Respiratory: Negative for shortness of breath.   Neurological: Negative for numbness.  All other systems reviewed and are negative.   Allergies  Cayenne and Ibuprofen  Home Medications   Prior to Admission medications   Medication Sig Start Date End Date Taking? Authorizing Provider  diphenhydrAMINE (BENADRYL) 25 MG tablet Take 2 tablets (50 mg total) by mouth every 4 (four) hours as needed. 08/20/14   Jimmy Mcmanamon, PA-C  EPINEPHrine (EPIPEN 2-PAK) 0.3 mg/0.3 mL IJ SOAJ injection Inject 0.3 mLs (0.3 mg total) into the muscle once as needed (for severe allergic reaction). CAll 911 immediately if you have to use this medicine 07/01/14   Jimmy ReiningNicole Pisciotta, PA-C  famotidine (PEPCID) 20 MG tablet Take 1 tablet (20 mg total) by mouth 2 (two) times daily. 08/20/14   Jimmy Jain, PA-C  predniSONE (DELTASONE) 50 MG tablet Take 1 tablet daily with breakfast. 08/20/14   Jimmy Baik, PA-C   BP 123/78 mmHg  Pulse 86  Temp(Src) 97.8 F (36.6 C) (Oral)  Resp 16  SpO2 100% Physical Exam  Constitutional: He is oriented to person, place, and time. He appears well-developed and well-nourished. No distress.  HENT:  Head: Normocephalic and atraumatic.  Nose: Nose normal.  Mouth/Throat: Oropharynx is clear and moist.  Angioedema identified to the upper lip with mild angioedema noted to the bottom lip that has resolved. Negative tongue swelling noted. Negative erythema, inflammation, lesions, sores identified it to the lips.  Eyes: Conjunctivae and EOM are normal. Pupils are equal, round, and reactive to light.  Right eye exhibits no discharge. Left eye exhibits no discharge.  Neck: Normal range of motion. Neck supple. No tracheal deviation present.  Negative neck stiffness  Cardiovascular: Normal rate, regular rhythm and normal heart sounds.   Pulses:      Radial pulses are 2+ on the right side, and 2+ on the left side.  Cap refill < 3 seconds  Pulmonary/Chest: Effort normal and breath  sounds normal. No respiratory distress. He has no wheezes. He has no rales.  Patient is able to speak in full sentences without difficulty Negative use of accessory muscles Negative stridor  Musculoskeletal: Normal range of motion.  Lymphadenopathy:    He has no cervical adenopathy.  Neurological: He is alert and oriented to person, place, and time. No cranial nerve deficit. He exhibits normal muscle tone. Coordination normal.  Skin: Skin is warm and dry. No rash noted. He is not diaphoretic. No erythema.  Psychiatric: He has a normal mood and affect. His behavior is normal. Thought content normal.  Nursing note and vitals reviewed.   ED Course  Procedures (including critical care time) DIAGNOSTIC STUDIES: Oxygen Saturation is 100% on RA, normal by my interpretation.    COORDINATION OF CARE: 8:08 PM- Pt advised of plan for treatment and pt agrees.  Labs Review Labs Reviewed - No data to display  Imaging Review No results found.   EKG Interpretation None      8:25 PM Discussed case in great detail with Jimmy Cook who agrees to administering Solu-Medrol, Benadryl, Pepcid. Agrees to monitoring patient. Agrees to plan of discharge.  9:45 PM Patient re-assessed. Patient speaking without difficulty. Negative tongue swelling identified. Swelling of the upper lip has decreased. Patient able to tolerate fluids by mouth without difficulty. Patient denied chest pain, shortness of breath, difficulty breathing, nausea, vomiting, dizziness, blurred vision, sudden loss of vision, throat closing sensation.  MDM   Final diagnoses:  Allergic reaction, initial encounter  Angioedema of lips, initial encounter   Medications  diphenhydrAMINE (BENADRYL) capsule 50 mg (50 mg Oral Given 08/20/14 2037)  methylPREDNISolone sodium succinate (SOLU-MEDROL) 125 mg/2 mL injection 125 mg (125 mg Intramuscular Given 08/20/14 2037)  famotidine (PEPCID) tablet 20 mg (20 mg Oral Given 08/20/14 2037)    Filed Vitals:   08/20/14 1840  BP: 123/78  Pulse: 86  Temp: 97.8 F (36.6 C)  TempSrc: Oral  Resp: 16  SpO2: 100%   I personally performed the services described in this documentation, which was scribed in my presence. The recorded information has been reviewed and is accurate.  This provider reviewed the patient's chart. Patient was seen and assessed in ED setting on 07/01/2014 regarding angioedema secondary to chicken Marsala. Patient was given epinephrine, Solu-Medrol, Benadryl, Pepcid. Patient is discharged home. Patient reported that he has not filled the prescription for the EpiPen secondary to cost issues. Patient presenting to the emergency department today with angioedema to the upper lip, secondary to Aleve. Negative tongue swelling. Patient able to speak in full sentences without difficulty, negative stridor or use of accessory muscles.  Discussed case with attending physician, Dr. Rodney Cruise. Cook, who agrees to plan of medications and monitor.  Patient has been monitored in ED setting for approximately 3 and a half hours without worsening of symptoms. Patient reported that he is feeling much better. Swelling of the upper lip has reduced. Patient able to tolerate fluids without difficulty. Negative tongue swelling. Negative stridor. Negative difficulty breathing. Negative signs of respiratory distress. Patient stable, afebrile. Patient not septic appearing.  Discharged patient. Discharge patient with Benadryl and Pepcid. [facility]. Discussed with patient to closely monitor symptoms and if symptoms are to worsen or change to report back to the ED - strict return instructions given.  Patient agreed to plan of care, understood, all questions answered.   Jimmy Mutton, PA-C 08/20/14 2202  Jimmy Forester, MD 08/20/14 [phone]

## 2014-08-20 NOTE — ED Notes (Signed)
Pt states he took aleve yesterday and it caused his lips to swell.  Swelling has gone down some with benadryl but still persists.  Denies SOB, hives.

## 2014-08-20 NOTE — Discharge Instructions (Signed)
Please call your doctor for a followup appointment within 24-48 hours. When you talk to your doctor please let them know that you were seen in the emergency department and have them acquire all of your records so that they can discuss the findings with you and formulate a treatment plan to fully care for your new and ongoing problems. Please call and set-up an appointment with [facility] Please take medications as prescribed  Pleas rest and stay hydrated If swelling continues tomorrow please report back to the Emergency Department. If tongue begins to swell please report back to the Emergency Department immediately  Please continue to monitor symptoms closely and if symptoms are to worsen or change (fever greater than 101, chills, sweating, nausea, vomiting, chest pain, shortness of breathe, difficulty breathing, weakness, numbness, tingling, worsening or changes to pain pattern, swelling increases, throat closing sensation, loss of sensation, tongue swelling) please report back to the Emergency Department immediately.    Angioedema Angioedema is sudden puffiness (swelling), often of the skin. It can happen:  On your face or privates (genitals).  In your belly (abdomen) or other body parts. It usually happens quickly and gets better in 1 or 2 days. It often starts at night and is found when you wake up. You may get red, itchy patches of skin (hives). Attacks can be dangerous if your breathing passages get puffy. The condition may happen only once, or it can come back at random times. It may happen for several years before it goes away for good. HOME CARE  Only take medicines as told by your doctor.  Always carry your emergency allergy medicines with you.  Wear a medical bracelet as told by your doctor.  Avoid things that you know will cause attacks (triggers). GET HELP IF:  You have another attack.  Your attacks happen more often or get worse.  The condition was passed  to you by your parents and you want to have children. GET HELP RIGHT AWAY IF:   Your mouth, tongue, or lips are very puffy.  You have trouble breathing.  You have trouble swallowing.  You pass out (faint). MAKE SURE YOU:   Understand these instructions.  Will watch your condition.  Will get help right away if you are not doing well or get worse. Document Released: 09/11/2009 Document Revised: 07/14/2013 Document Reviewed: 05/17/2013 [facility] Patient Information 2015 [place], [place]. This information is not intended to replace advice given to you by your health care provider. Make sure you discuss any questions you have with your health care provider.   Emergency Department Resource Guide 1) Find a Doctor and Pay Out of Pocket Although you won't have to find out who is covered by your insurance plan, it is a good idea to ask around and get recommendations. You will then need to call the office and see if the doctor you have chosen will accept you as a new patient and what types of options they offer for patients who are self-pay. Some doctors offer discounts or will set up payment plans for their patients who do not have insurance, but you will need to ask so you aren't surprised when you get to your appointment.  2) Contact Your Local Health Department Not all health departments have doctors that can see patients for sick visits, but many do, so it is worth a call to see if yours does. If you don't know where your local health department is, you can check in your phone book.  The CDC also has a tool to help you locate your state's health department, and many state websites also have listings of all of their local health departments.  3) Find a Walk-in Clinic If your illness is not likely to be very severe or complicated, you may want to try a walk in clinic. These are popping up all over the country in pharmacies, drugstores, and shopping centers. They're usually staffed by nurse  practitioners or physician assistants that have been trained to treat common illnesses and complaints. They're usually fairly quick and inexpensive. However, if you have serious medical issues or chronic medical problems, these are probably not your best option.  No Primary Care Doctor: - Call Health Connect at  [phone] - they can help you locate a primary care doctor that  accepts your insurance, provides certain services, etc. - Physician Referral Service- [phone]  Chronic Pain Problems: Organization         Address  Phone   Notes  [facility]  [phone] Patients need to be referred by their primary care doctor.   Medication Assistance: Organization         Address  Phone   Notes  [facility] Medication [facility] [address] [place], [place] [address] [phone] --Must be a resident of [facility] -- Must have NO insurance coverage whatsoever (no Medicaid/ Medicare, etc.) -- The pt. MUST have a primary care doctor that directs their care regularly and follows them in the community   [facility]ist  [phone]   [employer]  [phone]    Agencies that provide inexpensive medical care: Organization         Address  Phone   Notes  [facility]  [phone]   [facility]    [phone]   [facility] [address] [place], [place] [address] [phone]   [facility] [address] [place], [place] [phone]   [facility]anned Parenthood    [phone]   [facility]    [phone]   [facility] and [facility]  [address], [place] Phone:  [phone], Fax:  [phone] Hours of Operation:  9 am - 6 pm, M-F.  Also accepts Medicaid/Medicare and self-pay.  [facility] for [facility]  [address], [place] Phone: [phone], Fax: [phone]. Hours of Operation:  8:30 am -  5:30 pm, M-F.  Also accepts Medicaid and self-pay.  [facility] [place] [address], [place] Phone: [phone]   [facility] [address] Natasha Bence [place], [place] [phone], Ext. [phone] Mondays & Thursdays: 7-9 AM.  First 15 patients are seen on a first come, first serve basis.    Medicaid-accepting [facility] Providers:  Organization         Address  Phone   Notes  [facility] [address], [place] [phone] Also accepts self-pay patients.  [facility] [address] Laurell Josephs [place], [place]  [phone]   [facility] [address], [place] [phone]   [facility] [address] [place] [phone]   Renaye Rakers [address], [place]   [phone] Only accepts [place] patients after they have their name applied to their card.   Self-Pay (  no insurance) in [facility]:  Organization         Address  Phone   Notes  Sickle Cell Patients, [facility] [address] [place], [place] [phone]   [facility] [address] [place], [place] [phone]   [facility] [place]  [address], [place] [phone]   [facility]/Dr. Osei-Bonsu  [address] [place] or [address], [place] [phone] Phone number for both [place] and [place] locations is the same.  [facility] and [facility] [address], [place] [phone]   [facility] [address] [place] or [address] [phone]   [facility] [address], [place] [phone], phone; [phone], fax Sees patients 1st and 3rd Saturday of every month.  Must not qualify for public or private insurance (i.e. Medicaid, Medicare, [place] Health Choice,  Veterans' Benefits)  Household income should be no more than 200% of the poverty level The clinic cannot treat you if you are pregnant or think you are pregnant  Sexually transmitted diseases are not treated at the clinic.    Dental Care: Organization         Address  Phone  Notes  [facility] Department of [facility] [address] [place], [place] [phone] Accepts children up to age 68 who are enrolled in [place] or [place] Health Choice; pregnant women with a Medicaid card; and children who have applied for Medicaid or [place] Health Choice, but were declined, whose parents can pay a reduced fee at time of service.  [facility] Department of [facility]  [address], [place] [phone] Accepts children up to age 28 who are enrolled in [place] or [place] Health Choice; pregnant women with a Medicaid card; and children who have applied for Medicaid or [place] Health Choice, but were declined, whose parents can pay a reduced fee at time of service.  [facility]  [address] [place], [place] [phone] Patients are seen by appointment only. Walk-ins are not accepted. [facility] will see patients 24 years of age and older. Monday - Tuesday (8am-5pm) Most Wednesdays (8:30-5pm) $30 per visit, cash only  [facility] Adult Dental Access PROGRAM  [address], [facility] [phone] Patients are seen by appointment only. Walk-ins are not accepted. [facility] will see patients 69 years of age and older. One Wednesday Evening (Monthly: Volunteer Based).  $30 per visit, cash only  [employer]  [phone] for adults; Children under age 22, call Graduate Pediatric Dentistry at [phone]. Children aged 97-14, please call [phone] to request a pediatric application.  Dental services are provided in all areas of dental care including fillings, crowns and bridges, complete and  partial dentures, implants, gum treatment, root canals, and extractions. Preventive care is also provided. Treatment is provided to both adults and children. Patients are selected via a lottery and there is often a waiting list.   [facility] [address] [place]  [phone] [email or web address]   [facility] [address] [place], [place] [phone], Ext. [phone] Second and Fourth Thursday of each month, opens at 6:30 AM; Clinic ends at 9 AM.  Patients are seen on a first-come first-served basis, and a limited number are  seen during each clinic.   [facility]  [address], [place], [place] [phone]   Eligibility Requirements You must have lived in [place], [place], or [place] counties for at least the last three months.   You cannot be eligible for state or federal sponsored National Cityhealthcare insurance, including CIGNAVeterans Administration, [place]icaid, or [employer]Medicare.   You generally cannot be eligible for healthcare insurance through your employer.    How to apply: Eligibility screenings are held every Tuesday and Wednesday afternoon from 1:00 pm until 4:00 pm. You do not need an appointment for the interview!  [facility] [address], [place], [place] [phone]   [facility]  [phone]   [facility]  [phone]   [facility]  [phone]    Behavioral Health Resources in the Community: Intensive Outpatient Programs Organization         Address  Phone  Notes  [facility] [address], [place], [place] [phone]   [facility] [address], [place], [place] [phone]   [facility] [address], [place], [place]  [phone]   [facility] [address],  [place], [place] [phone] or [phone]   Substance Abuse Resources Organization          Address  Phone  Notes  [facility]  [phone]   [facility]  [phone]   [facility]  [phone]   [facility]  [phone]   Residential & Outpatient Substance Abuse Program  [phone]   Psychological Services Organization         Address  Phone  Notes  [facility]  [phone]- [phone]   [facility]  [phone]- [phone]   [facility] [address], [place] [phone] or [phone]    Mobile Crisis Teams Organization         Address  Phone  Notes  [facility], Mobile Crisis Care Unit  [phone]   [facility]  [address] [place], [place] [phone]   Doristine LocksSharon DeEsch [address] [place] [phone]    Self-Help/Support Groups Organization         Address  Phone             Notes  [facility]ental Health Assoc. of [employer] - variety of support groups  [phone] Call for more information  Narcotics Anonymous (NA), [facility] [address], [place]  2 meetings at this location   Statisticianesidential Treatment Programs Organization         Address  Phone  Notes  [facility] [address],    [place]  [phone]   [facility]  [address], [place] [phone], [place], [place] [phone]   [facility] [address], [place] [phone] Admissions: 8am-3pm M-F  [facility] [address]    [place], [place] [phone]   [facility] [address], [place], [place] [phone]   [facility] [address]  [place], [place] [phone]   [facility] - Intensive Outpatient [address], [place], [place] [phone]   [facility] ([facility] [address]  [place], [place] [phone] or [phone]   [facility] ([facility]) [address] [place], [place]  [phone] Accepts Medicaid  [facility] [address]  [place] [phone] Substance Abuse/Addiction Treatment   [facility] Behavioral Health Resources Organization         Address  Phone  Notes  [facility]  [phone]   Angie FavaJulie Brannon, PhD [address] [place], [place]   [phone] or [phone]   [facility]   [address] [place], [place] [phone](336) [phone]   [facility] [address], [place], [place] [phone] Insurance/Medicaid/sponsorship through [facility]  [facility] [address]                                    [place], [place] [phone] Therapy/tele-psych/case  [facility] [address].   [place], [place] [phone]    Dr. Lolly MustacheArfeen  [phone]   [facility] [place]  [employer] [facility] 1) [address], [place] 2) [address], [place] 3)  [address], [place] [phone]) [phone]  [phone](336) [phone]   [facility] Child Abuse Hotline [phone](336) 4508279268 or [phone](336) [phone] (After Hours)

## 2014-12-20 ENCOUNTER — Other Ambulatory Visit: Payer: Self-pay

## 2014-12-20 ENCOUNTER — Emergency Department (HOSPITAL_COMMUNITY)
Admission: EM | Admit: 2014-12-20 | Discharge: 2014-12-21 | Disposition: A | Discharge status: Home / Self Care | Payer: Self-pay | Attending: Emergency Medicine | Admitting: Emergency Medicine

## 2014-12-20 DIAGNOSIS — R000 Tachycardia, unspecified: Secondary | ICD-10-CM | POA: Insufficient documentation

## 2014-12-20 DIAGNOSIS — F1410 Cocaine abuse, uncomplicated: Secondary | ICD-10-CM | POA: Insufficient documentation

## 2014-12-20 DIAGNOSIS — F10929 Alcohol use, unspecified with intoxication, unspecified: Secondary | ICD-10-CM

## 2014-12-20 DIAGNOSIS — Y908 Blood alcohol level of 240 mg/100 ml or more: Secondary | ICD-10-CM | POA: Insufficient documentation

## 2014-12-20 DIAGNOSIS — F10129 Alcohol abuse with intoxication, unspecified: Secondary | ICD-10-CM | POA: Insufficient documentation

## 2014-12-20 LAB — BASIC METABOLIC PANEL
Anion gap: 11 (ref 5–15)
BUN: 6 mg/dL (ref 6–23)
CO2: 23 mmol/L (ref 19–32)
CREATININE: 1.09 mg/dL (ref 0.50–1.35)
Calcium: 8.7 mg/dL (ref 8.4–10.5)
Chloride: 108 mmol/L (ref 96–112)
GLUCOSE: 97 mg/dL (ref 70–99)
POTASSIUM: 4 mmol/L (ref 3.5–5.1)
Sodium: 142 mmol/L (ref 135–145)

## 2014-12-20 LAB — CBC WITH DIFFERENTIAL/PLATELET
BASOS ABS: 0 10*3/uL (ref 0.0–0.1)
Basophils Relative: 1 % (ref 0–1)
EOS PCT: 1 % (ref 0–5)
Eosinophils Absolute: 0 10*3/uL (ref 0.0–0.7)
HCT: 45.4 % (ref 39.0–52.0)
Hemoglobin: 15.7 g/dL (ref 13.0–17.0)
Lymphocytes Relative: 44 % (ref 12–46)
Lymphs Abs: 1.9 10*3/uL (ref 0.7–4.0)
MCH: 31.5 pg (ref 26.0–34.0)
MCHC: 34.6 g/dL (ref 30.0–36.0)
MCV: 91.2 fL (ref 78.0–100.0)
MONO ABS: 0.3 10*3/uL (ref 0.1–1.0)
Monocytes Relative: 7 % (ref 3–12)
Neutro Abs: 2 10*3/uL (ref 1.7–7.7)
Neutrophils Relative %: 47 % (ref 43–77)
PLATELETS: 143 10*3/uL — AB (ref 150–400)
RBC: 4.98 MIL/uL (ref 4.22–5.81)
RDW: 13.3 % (ref 11.5–15.5)
WBC: 4.3 10*3/uL (ref 4.0–10.5)

## 2014-12-20 LAB — ETHANOL: Alcohol, Ethyl (B): 317 mg/dL — ABNORMAL HIGH (ref 0–9)

## 2014-12-20 LAB — RAPID URINE DRUG SCREEN, HOSP PERFORMED
AMPHETAMINES: NOT DETECTED
BENZODIAZEPINES: NOT DETECTED
Barbiturates: NOT DETECTED
Cocaine: POSITIVE — AB
Opiates: NOT DETECTED
Tetrahydrocannabinol: NOT DETECTED

## 2014-12-20 MED ORDER — NALOXONE HCL 0.4 MG/ML IJ SOLN
INTRAMUSCULAR | Status: AC
  Filled 2014-12-20: qty 1

## 2014-12-20 MED ORDER — HALOPERIDOL LACTATE 5 MG/ML IJ SOLN: 5.0000 mg | Freq: Once | INTRAMUSCULAR | Status: DC

## 2014-12-20 MED ORDER — SODIUM CHLORIDE 0.9 % IV BOLUS (SEPSIS)
2000.0000 mL | Freq: Once | INTRAVENOUS | Status: AC
  Administered 2014-12-20: 2000 mL via INTRAVENOUS

## 2014-12-20 MED ORDER — HALOPERIDOL LACTATE 5 MG/ML IJ SOLN
5.0000 mg | Freq: Once | INTRAMUSCULAR | Status: AC
  Administered 2014-12-20: 5 mg via INTRAVENOUS
  Filled 2014-12-20: qty 1

## 2014-12-20 MED ORDER — LORAZEPAM 2 MG/ML IJ SOLN
2.0000 mg | Freq: Once | INTRAMUSCULAR | Status: AC
  Administered 2014-12-20: 2 mg via INTRAVENOUS
  Filled 2014-12-20: qty 1

## 2014-12-20 NOTE — ED Notes (Signed)
Pair of socks, shoes, cut shirt, and cell phone placed in patient belongings bag.

## 2014-12-20 NOTE — ED Notes (Signed)
Spoke with [place] police. Stated able to identify patient name and birthday.
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
## 2014-12-20 NOTE — ED Notes (Signed)
Obtain urine sample with in and out cath with nurse tech assist. Patient tolerated without incident.

## 2014-12-20 NOTE — ED Notes (Signed)
Doctor verbal ordered 4 point restraints with police, security and ems assisting hold patient on stretcher.

## 2014-12-20 NOTE — ED Notes (Signed)
Doctor at bedside ordered patient bilateral wrist restraints and to remove bilateral ankle restraints.

## 2014-12-20 NOTE — ED Notes (Signed)
EKG completed given to EDP.  

## 2014-12-20 NOTE — ED Notes (Signed)
Phlebotomy at bedside.

## 2014-12-21 NOTE — Discharge Instructions (Signed)
Alcohol Intoxication Jimmy Cook, follow-up with a primary care physician within 3 days to help you quit drinking alcohol. His symptoms worsen come back to emergency department immediately. Thank you. Alcohol intoxication occurs when you drink enough alcohol that it affects your ability to function. It can be mild or very severe. Drinking a lot of alcohol in a short time is called binge drinking. This can be very harmful. Drinking alcohol can also be more dangerous if you are taking medicines or other drugs. Some of the effects caused by alcohol may include:  Loss of coordination.  Changes in mood and behavior.  Unclear thinking.  Trouble talking (slurred speech).  Throwing up (vomiting).  Confusion.  Slowed breathing.  Twitching and shaking (seizures).  Loss of consciousness. HOME CARE  Do not drive after drinking alcohol.  Drink enough water and fluids to keep your pee (urine) clear or pale yellow. Avoid caffeine.  Only take medicine as told by your doctor. GET HELP IF:  You throw up (vomit) many times.  You do not feel better after a few days.  You frequently have alcohol intoxication. Your doctor can help decide if you should see a substance use treatment counselor. GET HELP RIGHT AWAY IF:  You become shaky when you stop drinking.  You have twitching and shaking.  You throw up blood. It may look bright red or like coffee grounds.  You notice blood in your poop (bowel movements).  You become lightheaded or pass out (faint). MAKE SURE YOU:   Understand these instructions.  Will watch your condition.  Will get help right away if you are not doing well or get worse. Document Released: 03/11/2008 Document Revised: 05/26/2013 Document Reviewed: 02/26/2013 [facility] Patient Information 2015 [place], [place]. This information is not intended to replace advice given to you by your health care provider. Make sure you discuss any questions you have with your health care  provider.
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
## 2020-06-07 DEATH — deceased
